# Patient Record
Sex: Male | Born: 1965 | Race: White | Hispanic: No | Marital: Married | Smoking: Current some day smoker
Health system: Southern US, Community
[De-identification: ages and names within clinical notes are randomized; demographics above are authoritative.]

## PROBLEM LIST (undated history)

## (undated) HISTORY — PX: CHOLECYSTECTOMY: SHX55

## (undated) HISTORY — PX: HERNIA REPAIR: SHX51

## (undated) HISTORY — PX: APPENDECTOMY: SHX54

---

## 2017-06-30 ENCOUNTER — Other Ambulatory Visit: Payer: Self-pay

## 2017-06-30 ENCOUNTER — Encounter (HOSPITAL_COMMUNITY): Payer: Self-pay | Admitting: Emergency Medicine

## 2017-06-30 ENCOUNTER — Emergency Department (HOSPITAL_COMMUNITY)
Admission: EM | Admit: 2017-06-30 | Discharge: 2017-07-01 | Disposition: A | Discharge status: Home / Self Care | Payer: Self-pay | Attending: Emergency Medicine | Admitting: Emergency Medicine

## 2017-06-30 DIAGNOSIS — Z79899 Other long term (current) drug therapy: Secondary | ICD-10-CM | POA: Insufficient documentation

## 2017-06-30 DIAGNOSIS — F17210 Nicotine dependence, cigarettes, uncomplicated: Secondary | ICD-10-CM | POA: Insufficient documentation

## 2017-06-30 DIAGNOSIS — K429 Umbilical hernia without obstruction or gangrene: Secondary | ICD-10-CM | POA: Insufficient documentation

## 2017-06-30 LAB — I-STAT CG4 LACTIC ACID, ED: Lactic Acid, Venous: 1.72 mmol/L (ref 0.5–1.9)

## 2017-06-30 MED ORDER — SODIUM CHLORIDE 0.9 % IV BOLUS
1000.0000 mL | Freq: Once | INTRAVENOUS | Status: AC
  Administered 2017-06-30: 1000 mL via INTRAVENOUS

## 2017-06-30 MED ORDER — HYDROMORPHONE HCL 1 MG/ML IJ SOLN
1.0000 mg | Freq: Once | INTRAMUSCULAR | Status: AC
  Administered 2017-06-30: 1 mg via INTRAVENOUS
  Filled 2017-06-30: qty 1

## 2017-06-30 MED ORDER — LORAZEPAM 2 MG/ML IJ SOLN
0.5000 mg | Freq: Once | INTRAMUSCULAR | Status: AC
  Administered 2017-06-30: 0.5 mg via INTRAVENOUS
  Filled 2017-06-30: qty 1

## 2017-06-30 NOTE — ED Provider Notes (Signed)
[facility]-EMERGENCY DEPT Provider Note   CSN: [id number] Arrival date & time: 06/30/17  2204     History   Chief Complaint Chief Complaint  Patient presents with  . Abdominal Pain    HPI Troy Moss is a 52 y.o. male.  52 year old male with a history of umbilical hernia x10 years presents to the emergency department for worsening pain at his umbilical hernia site.  He states that his pain began 3 to 4 hours ago and has been constant, nonradiating.  He denies taking any medications at home for pain control, though he is on daily Subutex.  No associated fevers, vomiting, bowel changes.  He denies being followed by any specialist for hernia management as he does not have insurance.  The history is provided by the patient. No language interpreter was used.    History reviewed. No pertinent past medical history.  There are no active problems to display for this patient.   Past Surgical History:  Procedure Laterality Date  . APPENDECTOMY    . CHOLECYSTECTOMY    . HERNIA REPAIR          Home Medications    Prior to Admission medications   Medication Sig Start Date End Date Taking? Authorizing Provider  buprenorphine (SUBUTEX) 8 MG SUBL SL tablet DISSOLVE ONE TABLET UNDER THE TONGUE THREE TIMES DAILY 06/22/17  Yes [provider]    Family History History reviewed. No pertinent family history.  Social History Social History   Tobacco Use  . Smoking status: Current Some Day Smoker    Packs/day: 1.00    Types: Cigarettes  . Smokeless tobacco: Never Used  Substance Use Topics  . Alcohol use: Never    Frequency: Never  . Drug use: Never     Allergies   Toradol [ketorolac tromethamine] and Tramadol   Review of Systems Review of Systems Ten systems reviewed and are negative for acute change, except as noted in the HPI.    Physical Exam Updated Vital Signs BP 131/76 (BP Location: Left Arm)   Pulse (!) 54   Temp 98.1 F (36.7  C) (Oral)   Resp 18   Ht  (1.753 m)   Wt 108.9 kg (240 lb)   SpO2 97%   BMI 35.44 kg/m   Physical Exam  Constitutional: He is oriented to person, place, and time. He appears well-developed and well-nourished.  Nontoxic appearing. Looks uncomfortable.  HENT:  Head: Normocephalic and atraumatic.  Eyes: Conjunctivae and EOM are normal. No scleral icterus.  Neck: Normal range of motion.  Cardiovascular: Normal rate, regular rhythm and intact distal pulses.  Pulmonary/Chest: Effort normal. No stridor. No respiratory distress.  Respirations even and unlabored  Abdominal: Soft.  Tender umbilical hernia with mild overlying skin redness.  Unable to reduce secondary to pain.  Abdomen is otherwise soft, obese.  Musculoskeletal: Normal range of motion.  Neurological: He is alert and oriented to person, place, and time.  Skin: Skin is warm and dry. No rash noted. He is not diaphoretic. No erythema. No pallor.  Psychiatric: He has a normal mood and affect. His behavior is normal.  Nursing note and vitals reviewed.    ED Treatments / Results  Labs (all labs ordered are listed, but only abnormal results are displayed) Labs Reviewed  COMPREHENSIVE METABOLIC PANEL - Abnormal; Notable for the following components:      Result Value   Glucose, Bld 127 (*)    All other components within normal limits  CBC  I-STAT CG4 LACTIC ACID, ED    EKG None  Radiology Ct Abdomen Pelvis W Contrast  Result Date: 07/01/2017 CLINICAL DATA:  Lower left abdominal pain. EXAM: CT ABDOMEN AND PELVIS WITH CONTRAST TECHNIQUE: Multidetector CT imaging of the abdomen and pelvis was performed using the standard protocol following bolus administration of intravenous contrast. CONTRAST:  ISOVUE-300 IOPAMIDOL (ISOVUE-300) INJECTION 61% COMPARISON:  None. FINDINGS: LOWER CHEST: No basilar pulmonary nodules or pleural effusion. No apical pericardial effusion. HEPATOBILIARY: Normal hepatic contours and density.  No intra- or extrahepatic biliary dilatation. Status post cholecystectomy. 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 SPLEEN: Normal. ADRENALS/URINARY TRACT: --Adrenal glands: Normal. --Right kidney/ureter: No hydronephrosis, nephroureterolithiasis, perinephric stranding or solid renal mass. --Left kidney/ureter: No hydronephrosis, nephroureterolithiasis, perinephric stranding or solid renal mass. --Urinary bladder: Normal for degree of distention STOMACH/BOWEL: --Stomach/Duodenum: No hiatal hernia or other gastric abnormality. Normal duodenal course. --Small bowel: No dilatation or inflammation. --Colon: No focal abnormality. --Appendix: Surgically absent. VASCULAR/LYMPHATIC: Normal course and caliber of the major abdominal vessels. No abdominal or pelvic lymphadenopathy. REPRODUCTIVE: Prostate is enlarged measuring 5.5 cm. MUSCULOSKELETAL. No bony spinal canal stenosis or focal osseous abnormality. OTHER: Fat containing paraumbilical hernia measuring 3.9 cm. IMPRESSION: 1. Fat containing paraumbilical hernia measuring 3.9 cm without acute inflammation. 2. Mildly enlarged prostate gland. 3. Moderate fatty atrophy of the pancreas. This may be a sequela of chronic pancreatitis. Electronically Signed   By: Deatra Robinson M.D.   On: 07/01/2017 02:14    Procedures Procedures (including critical care time)  Medications Ordered in ED Medications  iopamidol (ISOVUE-300) 61 % injection (has no administration in time range)  HYDROmorphone (DILAUDID) injection 1 mg (1 mg Intravenous Given 06/30/17 2318)  LORazepam (ATIVAN) injection 0.5 mg (0.5 mg Intravenous Given 06/30/17 2317)  sodium chloride 0.9 % bolus 1,000 mL (0 mLs Intravenous Stopped 07/01/17 0113)  iopamidol (ISOVUE-300) 61 % injection 100 mL (100 mLs Intravenous Contrast Given 07/01/17 0148)  acetaminophen (TYLENOL) tablet 1,000 mg (1,000 mg Oral Given 07/01/17 0528)     Initial Impression / Assessment and Plan / ED Course  I have reviewed  the triage vital signs and the nursing notes.  Pertinent labs & imaging results that were available during my care of the patient were reviewed by me and considered in my medical decision making (see chart for details).     60:42 PM 52 year old male presents to the emergency department for evaluation of an umbilical hernia which he had for 10 years.  He reports increased pain to his hernia over the past 3 to 4 hours with inability to reduce it.  He states that it is usually soft and easily reducible.  He has some erythema at the umbilicus with palpable hernia.  Unable to reduce.  Will attempt adequate analgesia and repeat reduction attempt.  11:38 PM Patient given Ativan as well as Dilaudid with current ability to reduce umbilical hernia.  Hernia is now soft.  Area is becoming less erythematous.  Will continue to obtain CT scan to ensure no concerning process.  Patient appearing more comfortable.  2:18 AM CT with evidence of fat-containing umbilical hernia without acute inflammation.  No evidence of bowel strangulation or incarceration.  Labs reassuring.  Patient appropriate for discharge when medications metabolized.  5:53 AM Patient has metabolized his benzodiazepines and pain medicine. C/o headache. Tylenol ordered. Much more responsive. Able to ambulate in the ED independently without difficulty. Will d/c with general surgery follow up for definitive hernia management.   Final Clinical Impressions(s) /  ED Diagnoses   Final diagnoses:  Umbilical hernia without obstruction and without gangrene    ED Discharge Orders    None       Antony Madura, PA-C 07/01/17 1610    Marily Memos, MD 07/04/17 [phone]

## 2017-06-30 NOTE — ED Triage Notes (Signed)
Pt reports having left lower abd pain that started 3-4 hours ago at area of where hernia is. Pt reports attempting to reduce it but was not able to go back in.

## 2017-07-01 ENCOUNTER — Encounter (HOSPITAL_COMMUNITY): Payer: Self-pay

## 2017-07-01 ENCOUNTER — Emergency Department (HOSPITAL_COMMUNITY): Payer: Self-pay

## 2017-07-01 LAB — COMPREHENSIVE METABOLIC PANEL
ALT: 25 U/L (ref 17–63)
ANION GAP: 10 (ref 5–15)
AST: 22 U/L (ref 15–41)
Albumin: 3.6 g/dL (ref 3.5–5.0)
Alkaline Phosphatase: 72 U/L (ref 38–126)
BUN: 17 mg/dL (ref 6–20)
CO2: 25 mmol/L (ref 22–32)
Calcium: 8.9 mg/dL (ref 8.9–10.3)
Chloride: 106 mmol/L (ref 101–111)
Creatinine, Ser: 0.79 mg/dL (ref 0.61–1.24)
Glucose, Bld: 127 mg/dL — ABNORMAL HIGH (ref 65–99)
POTASSIUM: 3.5 mmol/L (ref 3.5–5.1)
Sodium: 141 mmol/L (ref 135–145)
TOTAL PROTEIN: 6.6 g/dL (ref 6.5–8.1)
Total Bilirubin: 0.5 mg/dL (ref 0.3–1.2)

## 2017-07-01 LAB — CBC
HEMATOCRIT: 43.7 % (ref 39.0–52.0)
Hemoglobin: 14.8 g/dL (ref 13.0–17.0)
MCH: 27.7 pg (ref 26.0–34.0)
MCHC: 33.9 g/dL (ref 30.0–36.0)
MCV: 81.8 fL (ref 78.0–100.0)
Platelets: 206 10*3/uL (ref 150–400)
RBC: 5.34 MIL/uL (ref 4.22–5.81)
RDW: 13.7 % (ref 11.5–15.5)
WBC: 7.5 10*3/uL (ref 4.0–10.5)

## 2017-07-01 MED ORDER — ACETAMINOPHEN 500 MG PO TABS
1000.0000 mg | ORAL_TABLET | Freq: Once | ORAL | Status: AC
  Administered 2017-07-01: 1000 mg via ORAL
  Filled 2017-07-01: qty 2

## 2017-07-01 MED ORDER — IOPAMIDOL (ISOVUE-300) INJECTION 61%
INTRAVENOUS | Status: AC
  Filled 2017-07-01: qty 100

## 2017-07-01 MED ORDER — IOPAMIDOL (ISOVUE-300) INJECTION 61%
100.0000 mL | Freq: Once | INTRAVENOUS | Status: AC | PRN
  Administered 2017-07-01: 100 mL via INTRAVENOUS

## 2017-07-01 NOTE — ED Notes (Addendum)
Ambulated around unit gait is steady denies abdominal pain or re-emergence of ambilical hernia

## 2017-07-01 NOTE — ED Provider Notes (Addendum)
Medical screening examination/treatment/procedure(s) were conducted as a shared visit with non-physician practitioner(s) and myself.  I personally evaluated the patient during the encounter.   10 years with umbilical hernia. Worsened pain tonight.  Prior to my evaluation, Tresa Endo, PA-C had reduced hernia successfully. It protrudes again but easily reduces without obvious obstruction/incarceration. On reevaluation while sleeping, patient having episodes of apnea. No significant hypoxia. Always starts breathign again. Suspect undiagnosed sleep apnea, would recommend he follows up as an outpatient for a sleep study.     Marily Memos, MD 07/04/17 [phone]

## 2019-02-08 IMAGING — CT CT ABD-PELV W/ CM
2 of 5 series · 16 of 46 positions shown, 18 images · IV contrast (ISOVUE)
Comparison: None.

CLINICAL DATA: Lower left abdominal pain.

EXAM:
CT ABDOMEN AND PELVIS WITH CONTRAST
TECHNIQUE: Multidetector CT imaging of the abdomen and pelvis was performed
using the standard protocol following bolus administration of
intravenous contrast.
CONTRAST:  100mL PSROCY-2XX IOPAMIDOL (PSROCY-2XX) INJECTION 61%

[Series 2: axial st · axial · 0.86mm/px · z∈[+1011,+1446]mm · 13 of 101 slices shown, 15 images]
[im 7/101  soft-tissue]
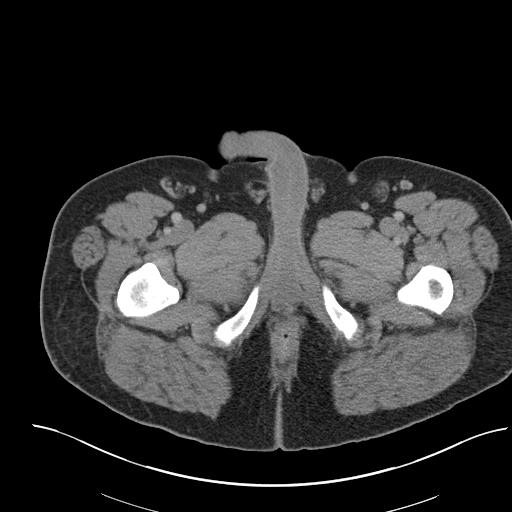
[im 7/101  bone]
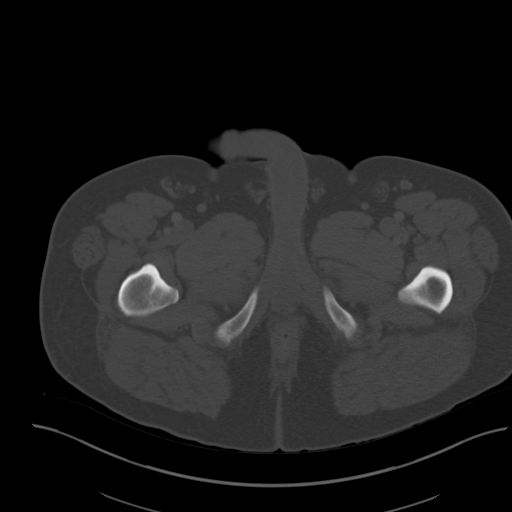
[im 13/101  soft-tissue]
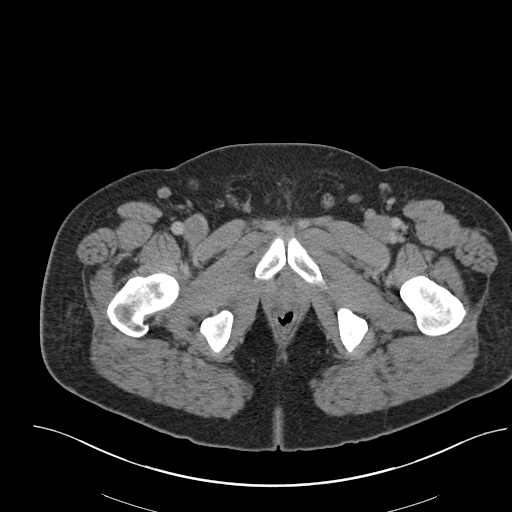
[im 19/101  soft-tissue]
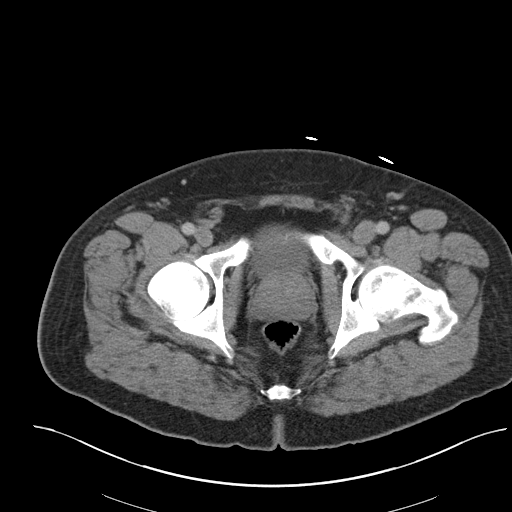
[im 32/101  soft-tissue]
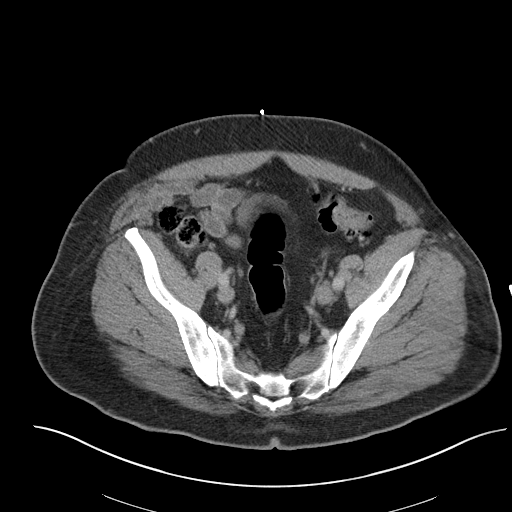
[im 38/101  soft-tissue]
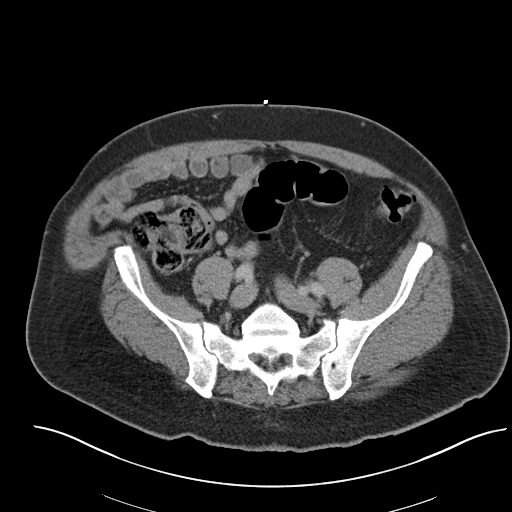
[im 44/101  soft-tissue]
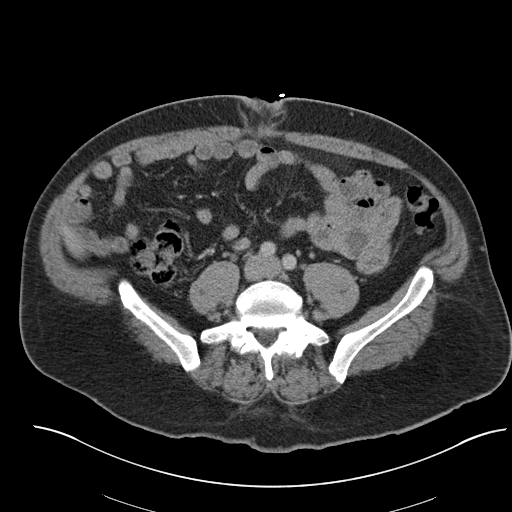
[im 51/101  soft-tissue]
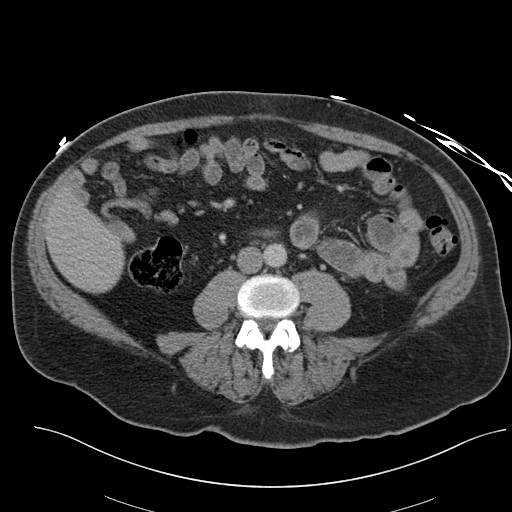
[im 57/101  soft-tissue]
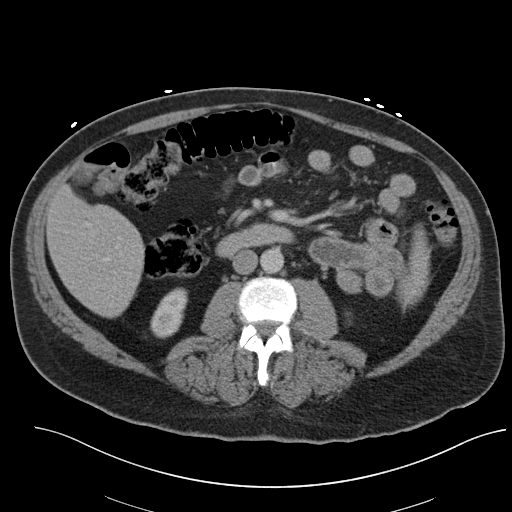
[im 63/101  soft-tissue]
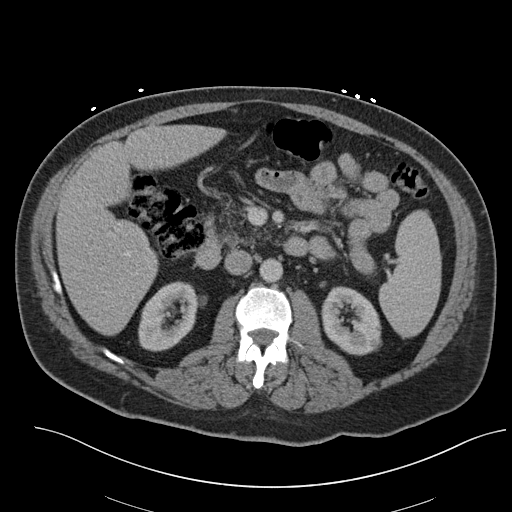
[im 63/101  bone]
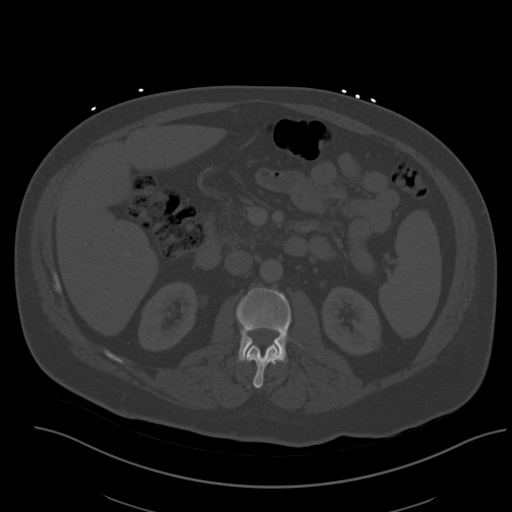
[im 69/101  soft-tissue]
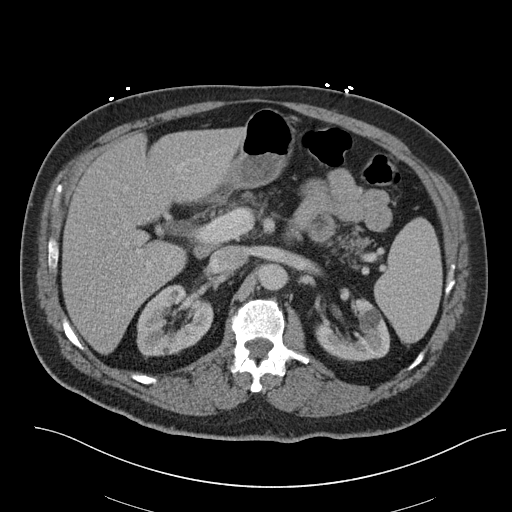
[im 82/101  soft-tissue]
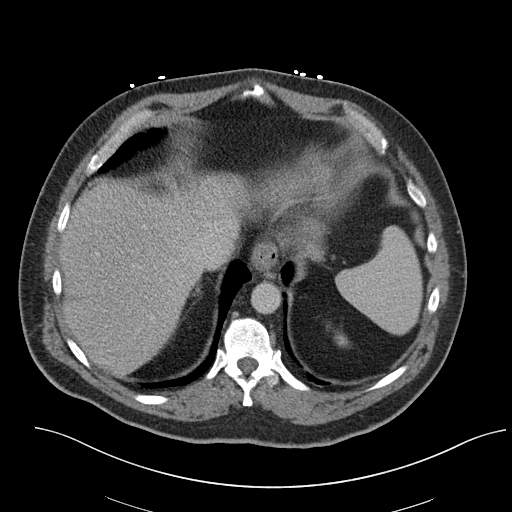
[im 88/101  soft-tissue]
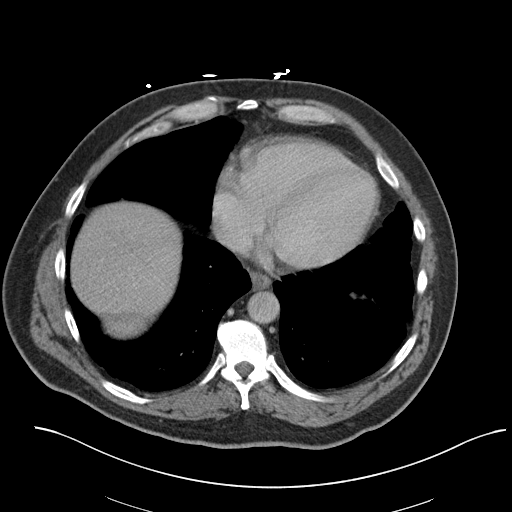
[im 94/101  soft-tissue]
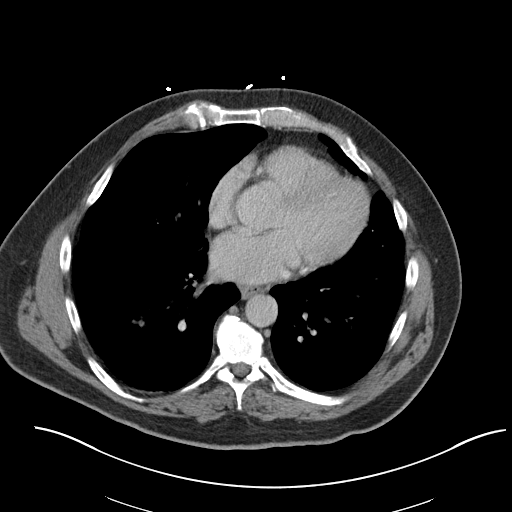

[Series 5: coronal st · coronal · 0.79mm/px · 3 of 101 slices shown]
[im 34/101  soft-tissue]
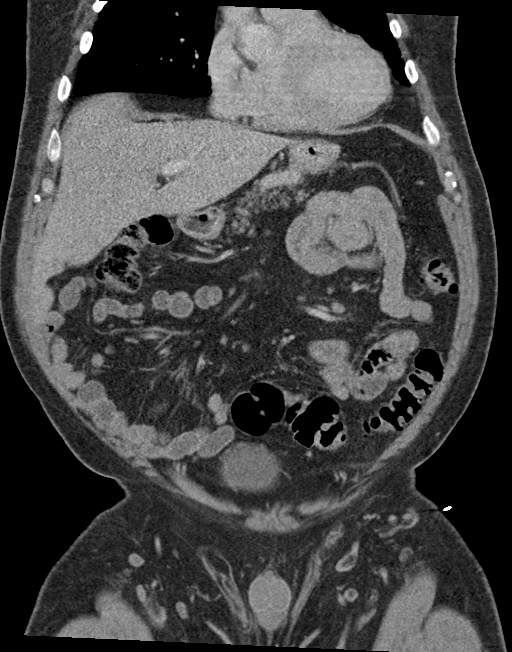
[im 45/101  soft-tissue]
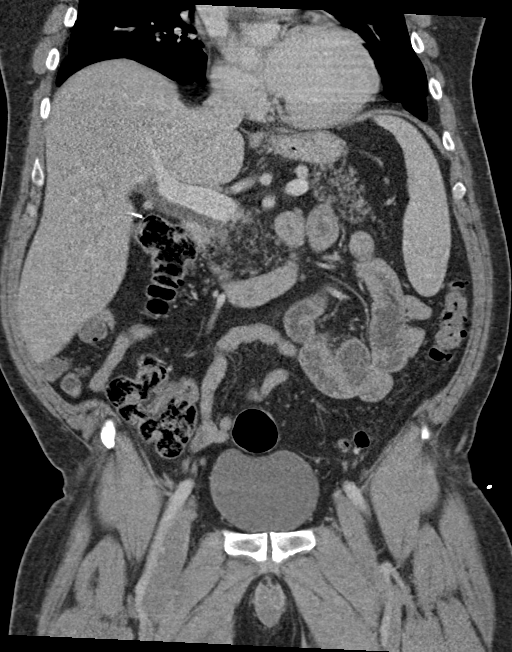
[im 56/101  soft-tissue]
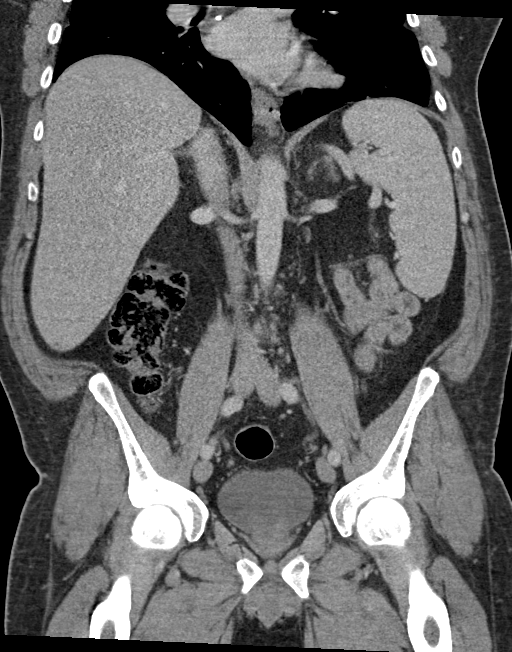

[16 of 46 positions shown; findings below may reference images not displayed]

FINDINGS: LOWER CHEST: No basilar pulmonary nodules or pleural effusion. No
apical pericardial effusion.

HEPATOBILIARY: Normal hepatic contours and density. No intra- or
extrahepatic biliary dilatation. Status post cholecystectomy.

PANCREAS: Chronic partial fatty replacement of the pancreas.

SPLEEN: Normal.

ADRENALS/URINARY TRACT:

--Adrenal glands: Normal.

--Right kidney/ureter: No hydronephrosis, nephroureterolithiasis,
perinephric stranding or solid renal mass.

--Left kidney/ureter: No hydronephrosis, nephroureterolithiasis,
perinephric stranding or solid renal mass.

--Urinary bladder: Normal for degree of distention

STOMACH/BOWEL:

--Stomach/Duodenum: No hiatal hernia or other gastric abnormality.
Normal duodenal course.

--Small bowel: No dilatation or inflammation.

--Colon: No focal abnormality.

--Appendix: Surgically absent.

VASCULAR/LYMPHATIC: Normal course and caliber of the major abdominal
vessels. No abdominal or pelvic lymphadenopathy.

REPRODUCTIVE: Prostate is enlarged measuring 5.5 cm.

MUSCULOSKELETAL. No bony spinal canal stenosis or focal osseous
abnormality.

OTHER: Fat containing paraumbilical hernia measuring 3.9 cm.
IMPRESSION: 1. Fat containing paraumbilical hernia measuring 3.9 cm without
acute inflammation.
2. Mildly enlarged prostate gland.
3. Moderate fatty atrophy of the pancreas. This may be a sequela of
chronic pancreatitis.
